# Patient Record
Sex: Male | Born: 1965 | Race: White | Hispanic: No | Marital: Married
Health system: Southern US, Community
[De-identification: ages and names within clinical notes are randomized; demographics above are authoritative.]

---

## 2007-12-10 ENCOUNTER — Ambulatory Visit: Payer: Self-pay | Admitting: Hematology and Oncology

## 2007-12-23 ENCOUNTER — Ambulatory Visit (HOSPITAL_COMMUNITY): Admission: RE | Admit: 2007-12-23 | Discharge: 2007-12-23 | Payer: Self-pay | Admitting: Hematology and Oncology

## 2007-12-23 LAB — CBC WITH DIFFERENTIAL/PLATELET
Basophils Absolute: 0.1 10*3/uL (ref 0.0–0.1)
Eosinophils Absolute: 0.2 10*3/uL (ref 0.0–0.5)
HGB: 17 g/dL (ref 13.0–17.1)
MCV: 92.5 fL (ref 81.6–98.0)
MONO%: 4.1 % (ref 0.0–13.0)
NEUT#: 9.6 10*3/uL — ABNORMAL HIGH (ref 1.5–6.5)
RDW: 11 % — ABNORMAL LOW (ref 11.2–14.6)
lymph#: 3.6 10*3/uL — ABNORMAL HIGH (ref 0.9–3.3)

## 2007-12-30 LAB — COMPREHENSIVE METABOLIC PANEL
Albumin: 4.8 g/dL (ref 3.5–5.2)
BUN: 11 mg/dL (ref 6–23)
Calcium: 9.5 mg/dL (ref 8.4–10.5)
Chloride: 103 mEq/L (ref 96–112)
Glucose, Bld: 89 mg/dL (ref 70–99)
Potassium: 4.6 mEq/L (ref 3.5–5.3)

## 2007-12-30 LAB — PROTEIN ELECTROPHORESIS, SERUM
Albumin ELP: 63.9 % (ref 55.8–66.1)
Alpha-2-Globulin: 11.3 % (ref 7.1–11.8)
Beta Globulin: 5.6 % (ref 4.7–7.2)
Gamma Globulin: 10.9 % — ABNORMAL LOW (ref 11.1–18.8)
Total Protein, Serum Electrophoresis: 7.1 g/dL (ref 6.0–8.3)

## 2007-12-30 LAB — LEUKOCYTE ALKALINE PHOS

## 2007-12-30 LAB — LACTATE DEHYDROGENASE: LDH: 151 U/L (ref 94–250)

## 2008-01-02 LAB — BCR/ABL

## 2008-07-05 ENCOUNTER — Inpatient Hospital Stay (HOSPITAL_COMMUNITY): Admission: EM | Admit: 2008-07-05 | Discharge: 2008-07-06 | Payer: Self-pay | Admitting: Emergency Medicine

## 2008-07-05 ENCOUNTER — Encounter (INDEPENDENT_AMBULATORY_CARE_PROVIDER_SITE_OTHER): Payer: Self-pay | Admitting: Surgery

## 2008-07-09 ENCOUNTER — Ambulatory Visit: Payer: Self-pay | Admitting: Hematology and Oncology

## 2008-07-13 LAB — CBC WITH DIFFERENTIAL/PLATELET
Basophils Absolute: 0 10*3/uL (ref 0.0–0.1)
Eosinophils Absolute: 0.2 10*3/uL (ref 0.0–0.5)
LYMPH%: 28.2 % (ref 14.0–48.0)
MCH: 35.3 pg — ABNORMAL HIGH (ref 28.0–33.4)
MCHC: 34.8 g/dL (ref 32.0–35.9)
MCV: 101.6 fL — ABNORMAL HIGH (ref 81.6–98.0)
MONO#: 0.5 10*3/uL (ref 0.1–0.9)
NEUT%: 65.8 % (ref 40.0–75.0)
RBC: 4.67 10*6/uL (ref 4.20–5.71)
RDW: 13.3 % (ref 11.2–14.6)
lymph#: 3.6 10*3/uL — ABNORMAL HIGH (ref 0.9–3.3)

## 2008-07-15 LAB — PROTEIN ELECTROPHORESIS, SERUM
Albumin ELP: 64.2 % (ref 55.8–66.1)
Alpha-1-Globulin: 4.6 % (ref 2.9–4.9)
Beta Globulin: 5.7 % (ref 4.7–7.2)
Total Protein, Serum Electrophoresis: 7 g/dL (ref 6.0–8.3)

## 2008-07-15 LAB — COMPREHENSIVE METABOLIC PANEL
BUN: 15 mg/dL (ref 6–23)
CO2: 26 mEq/L (ref 19–32)
Creatinine, Ser: 1.25 mg/dL (ref 0.40–1.50)
Glucose, Bld: 126 mg/dL — ABNORMAL HIGH (ref 70–99)
Sodium: 140 mEq/L (ref 135–145)
Total Bilirubin: 0.5 mg/dL (ref 0.3–1.2)
Total Protein: 7 g/dL (ref 6.0–8.3)

## 2009-01-11 ENCOUNTER — Ambulatory Visit: Payer: Self-pay | Admitting: Hematology and Oncology

## 2009-01-13 LAB — BASIC METABOLIC PANEL
BUN: 14 mg/dL (ref 6–23)
CO2: 26 mEq/L (ref 19–32)
Calcium: 9.6 mg/dL (ref 8.4–10.5)
Creatinine, Ser: 1.23 mg/dL (ref 0.40–1.50)
Glucose, Bld: 112 mg/dL — ABNORMAL HIGH (ref 70–99)

## 2009-01-13 LAB — CBC WITH DIFFERENTIAL/PLATELET
BASO%: 1 % (ref 0.0–2.0)
Basophils Absolute: 0.1 10*3/uL (ref 0.0–0.1)
HCT: 46.6 % (ref 38.7–49.9)
HGB: 16 g/dL (ref 13.0–17.1)
LYMPH%: 24 % (ref 14.0–48.0)
MCHC: 34.4 g/dL (ref 32.0–35.9)
MONO#: 0.6 10*3/uL (ref 0.1–0.9)
NEUT%: 69.5 % (ref 40.0–75.0)
Platelets: 230 10*3/uL (ref 145–400)
WBC: 14.9 10*3/uL — ABNORMAL HIGH (ref 4.0–10.0)

## 2009-02-05 IMAGING — CT CT PELVIS W/O CM
2 of 4 series · 17 of 46 positions shown, 19 images · non-contrast
Comparison: None available.

CTA ABDOMEN

CLINICAL DATA: Abdominal pain, right lower quadrant pain.
Elevated white blood cell count.

CT  ABDOMEN AND PELVIS
TECHNIQUE: Multidetector CT imaging of the abdomen and pelvis was
performed without administration of intravenous contrast.
Multiplanar reconstructed images obtained and reviewed to evaluate
the vascular anatomy.

[Series 2: 220 stone 5.0 b40f st · axial · 0.74mm/px · z∈[+911,+1271]mm · 14 of 78 slices shown, 16 images]
[im 3/78  soft-tissue]
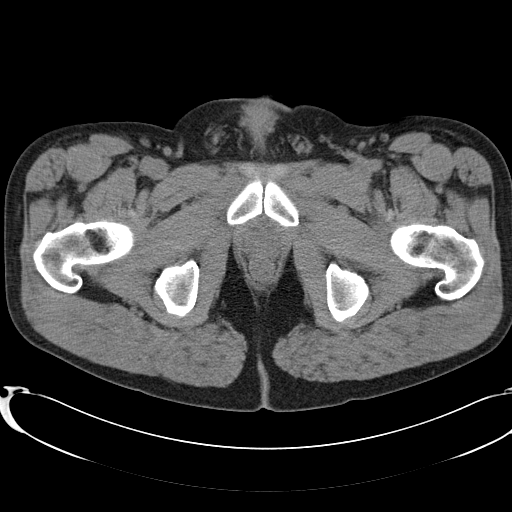
[im 3/78  bone]
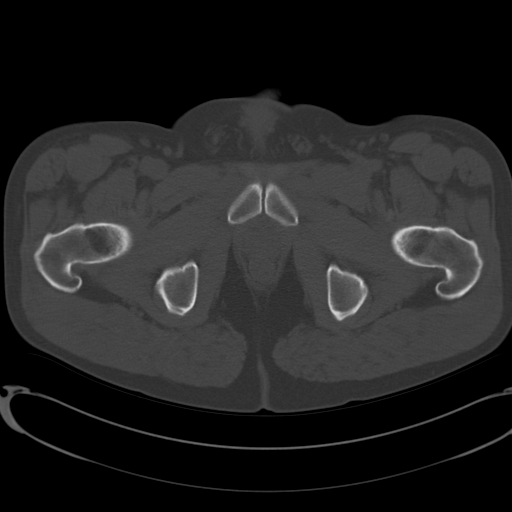
[im 9/78  soft-tissue]
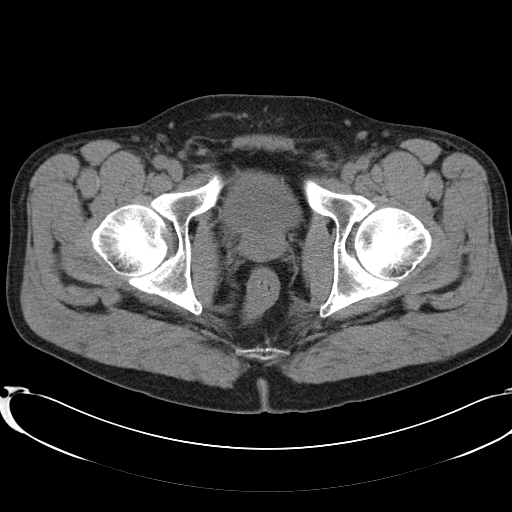
[im 15/78  soft-tissue]
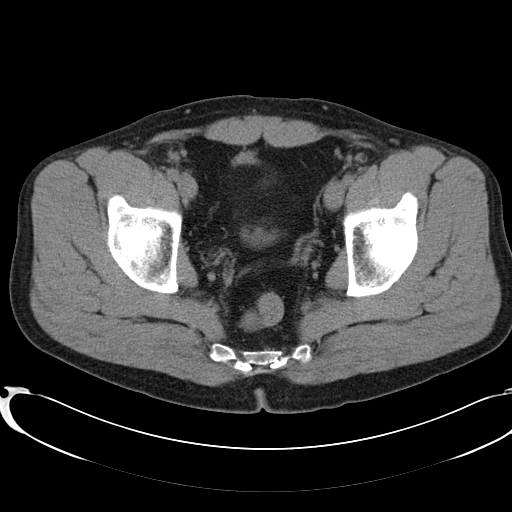
[im 20/78  soft-tissue]
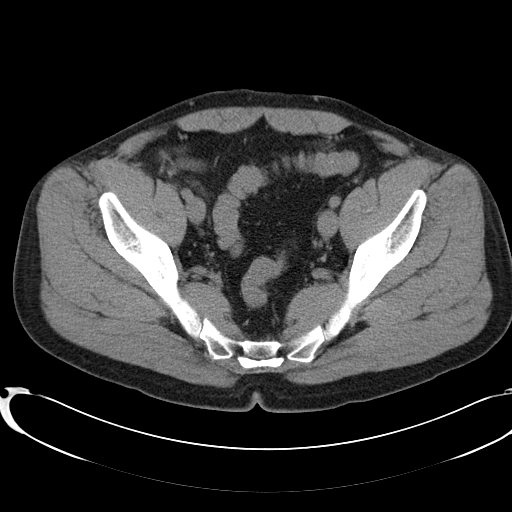
[im 26/78  soft-tissue]
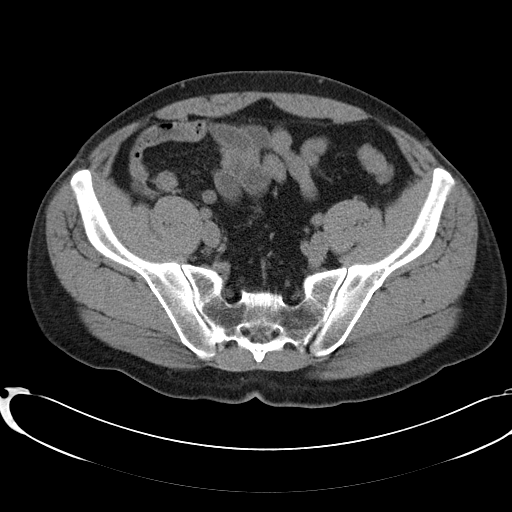
[im 32/78  soft-tissue]
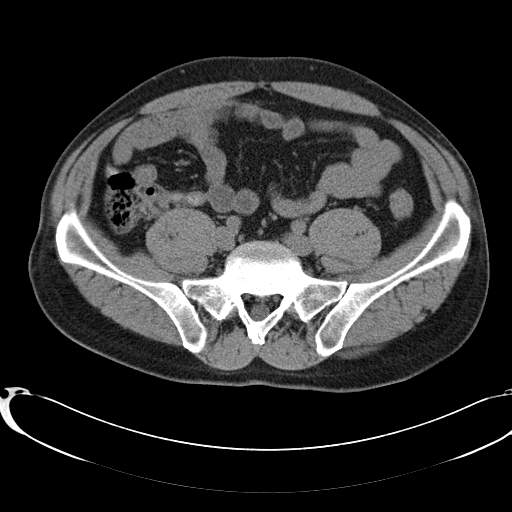
[im 38/78  soft-tissue]
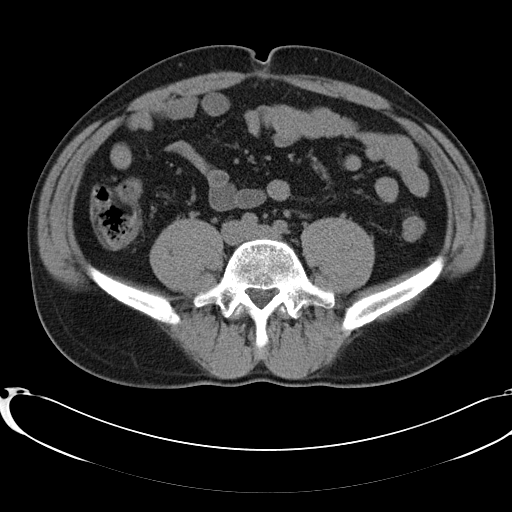
[im 40/78  soft-tissue]
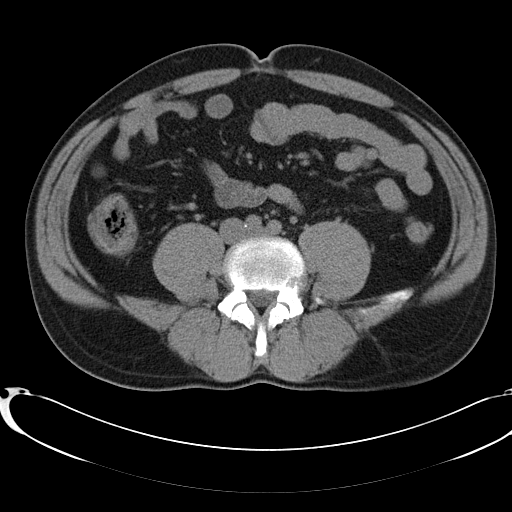
[im 46/78  soft-tissue]
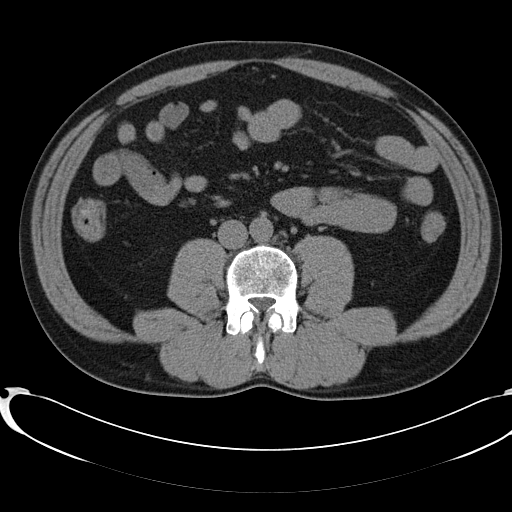
[im 46/78  bone]
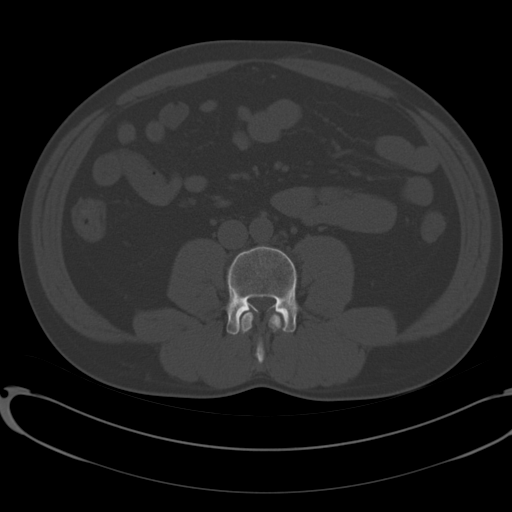
[im 52/78  soft-tissue]
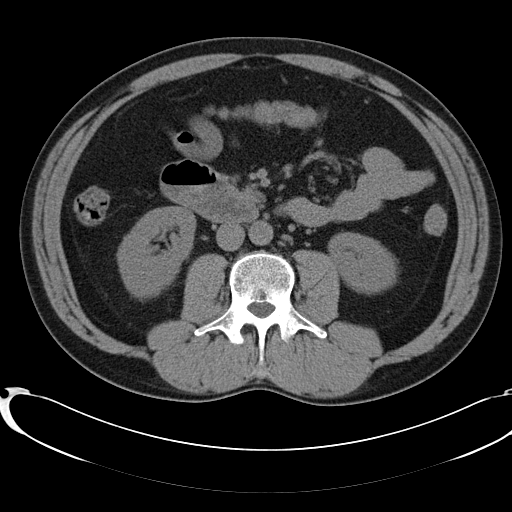
[im 58/78  soft-tissue]
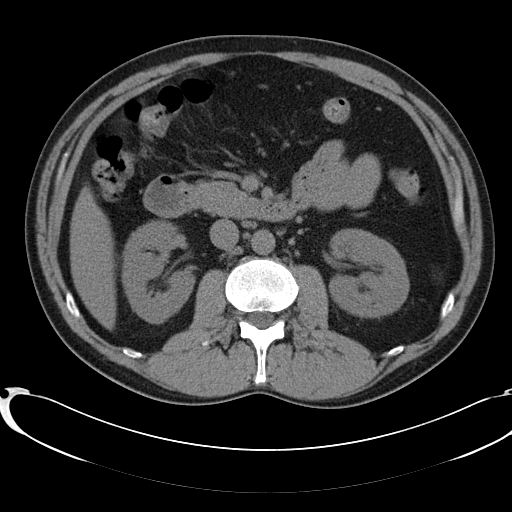
[im 63/78  soft-tissue]
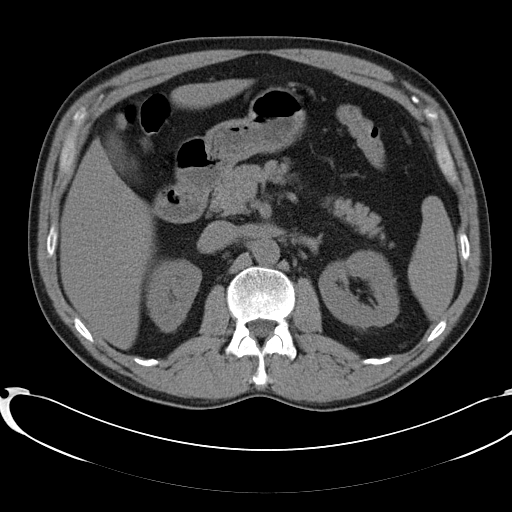
[im 69/78  soft-tissue]
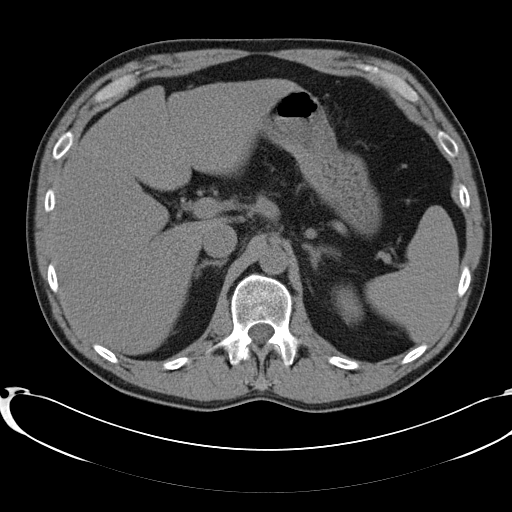
[im 75/78  soft-tissue]
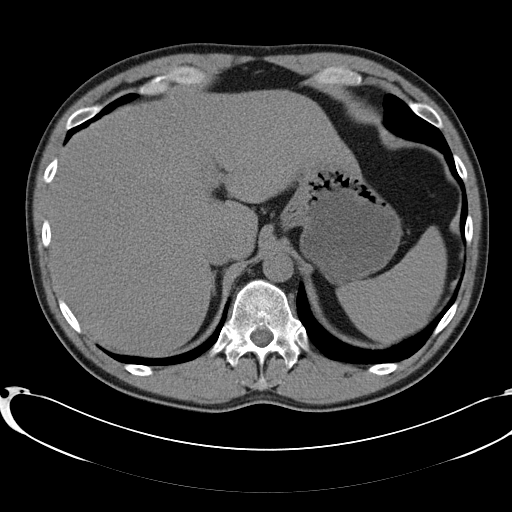

[Series 602: <mpr thick range> · coronal · 0.80mm/px · 3 of 76 slices shown]
[im 26/76  soft-tissue]
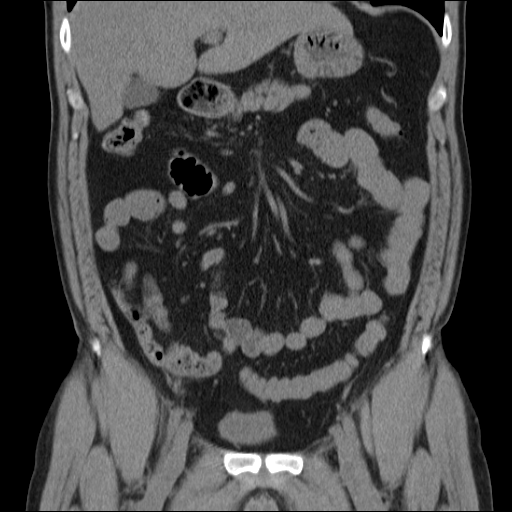
[im 34/76  soft-tissue]
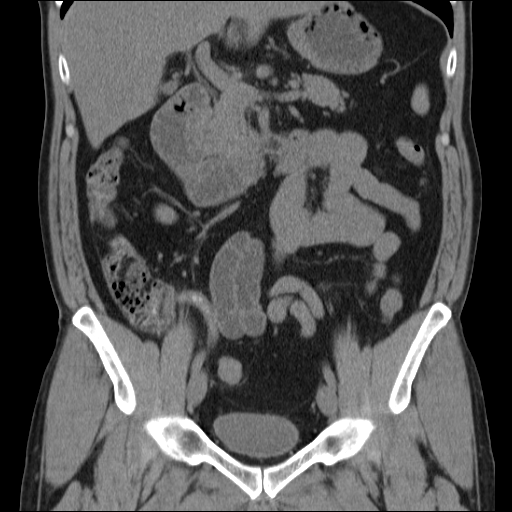
[im 42/76  soft-tissue]
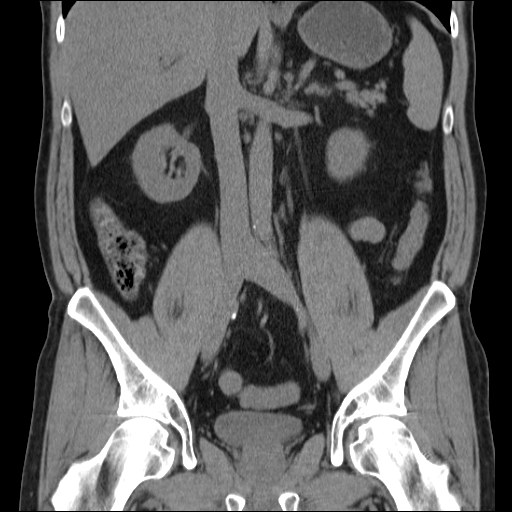

[17 of 46 positions shown; findings below may reference images not displayed]

FINDINGS: Minimal amount of lung parenchyma demonstrates some mild
atelectatic change is otherwise clear.  No pleural effusion is
identified.

The kidneys and ureters have a normal CT appearance bilaterally
without hydronephrosis or urinary tract stone.  The imaged portions
of the liver, gallbladder, biliary tree, adrenal glands, spleen and
pancreas also all appear normal.  Stomach and small bowel have a
normal uninfused appearance.  There is no abdominal lymphadenopathy
or fluid collection.  The no focal bony abnormality.
IMPRESSION: Negative abdomen CT scan.]]

CTA PELVIS
FINDINGS: The appendix is dilated with periappendiceal inflammatory
change.  High attenuation material is present within the appendix.
There is no perforation or abscess.  No pelvic fluid or
lymphadenopathy.  The colon appears normal.  No focal bony
abnormality.
IMPRESSION: Study is positive for appendicitis without abscess or perforation.

## 2009-04-12 ENCOUNTER — Encounter: Admission: RE | Admit: 2009-04-12 | Discharge: 2009-04-12 | Payer: Self-pay | Admitting: Family Medicine

## 2009-11-13 IMAGING — CR DG CHEST 2V
2 series · 2 of 2 positions shown · non-contrast
Comparison: 12/23/2007.

CLINICAL DATA: Cough

CHEST - 2 VIEW

[view not recorded (1 of 2)]
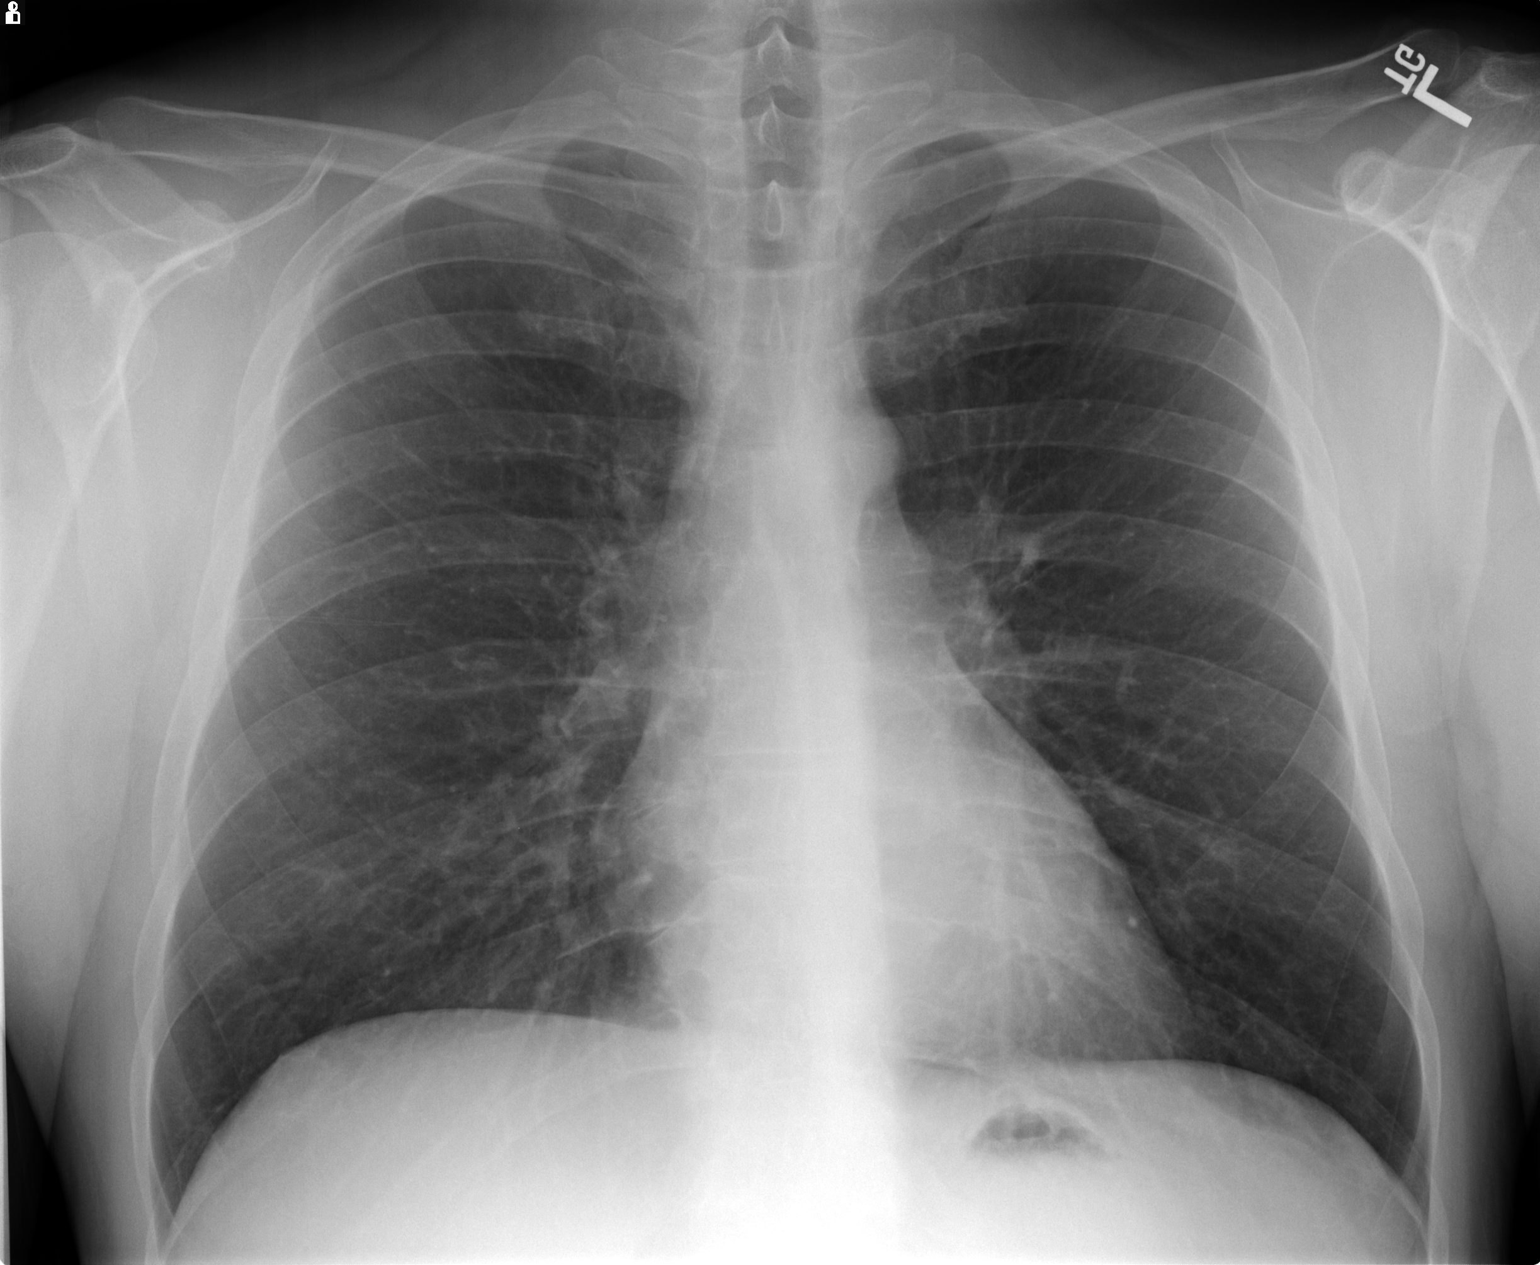

[view not recorded (2 of 2)]
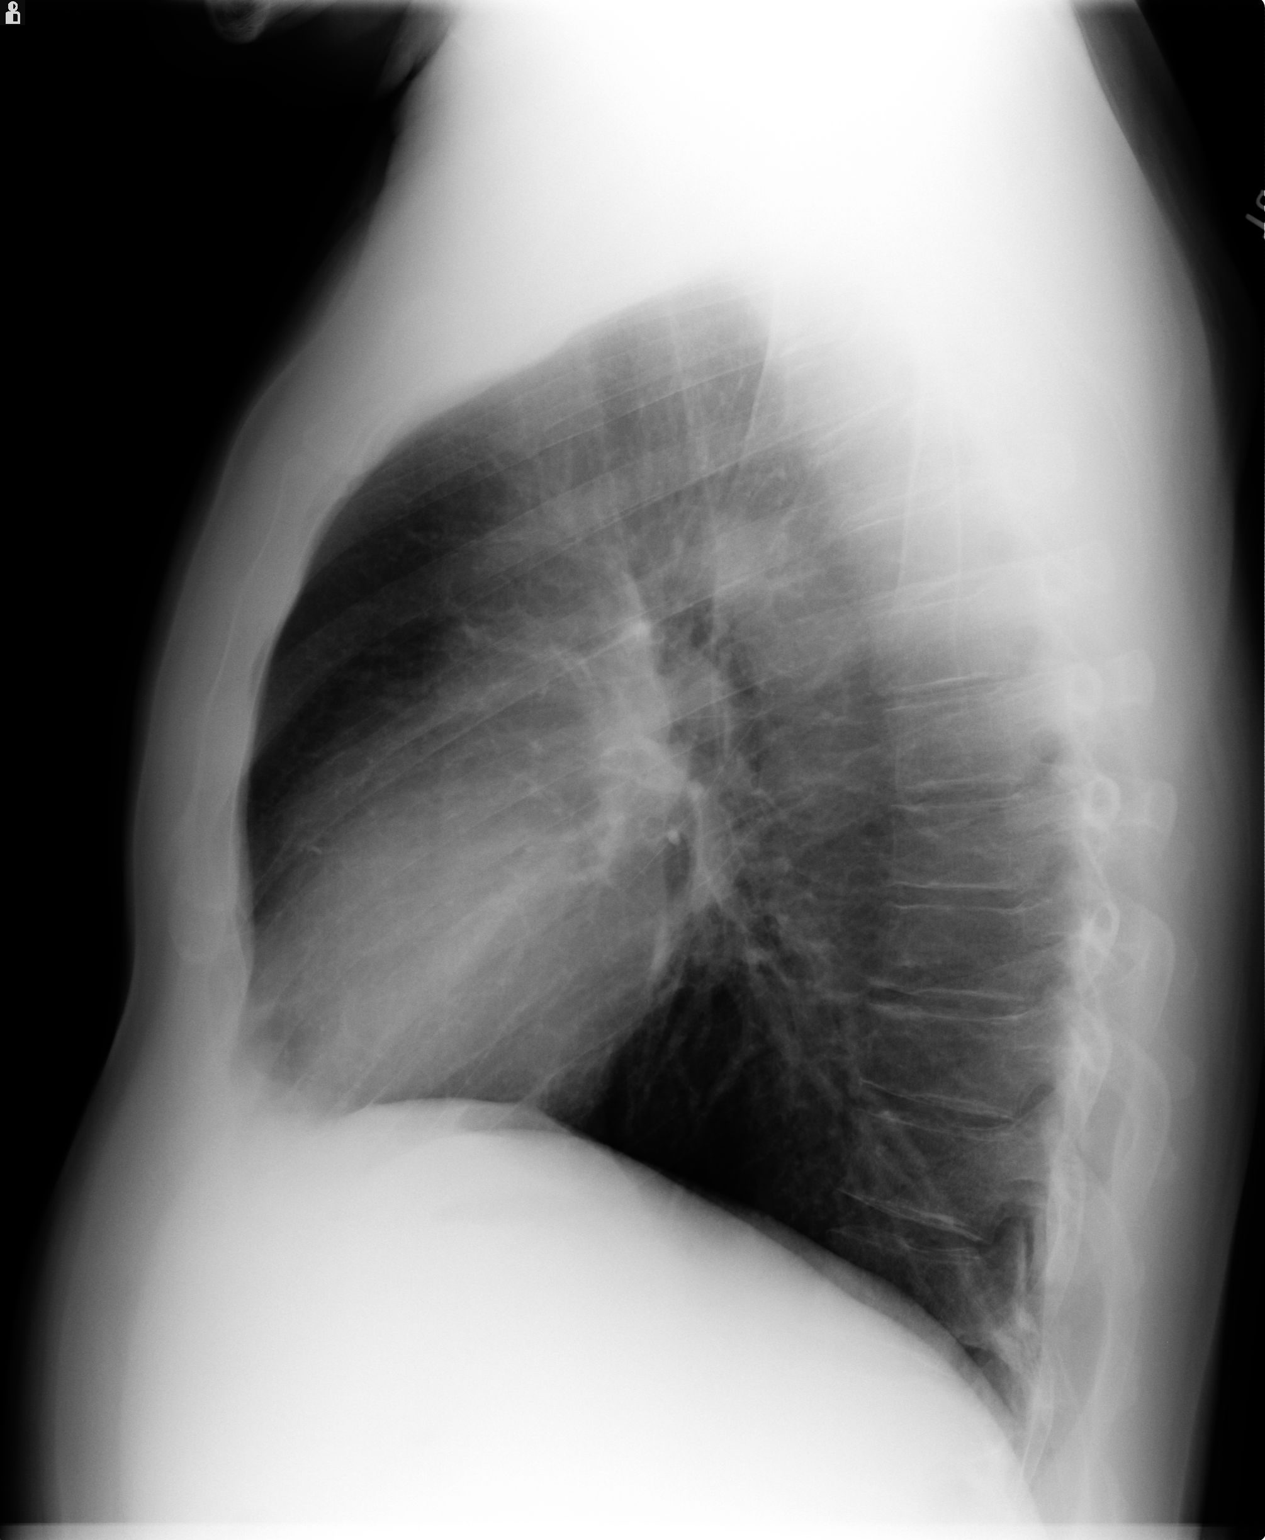

[2 of 2 positions shown; findings below may reference images not displayed]

FINDINGS: The lungs are clear.  The heart and mediastinal
structures are normal.  The osseous structures are unremarkable.
IMPRESSION: No evidence for active chest disease.

## 2011-04-10 NOTE — Op Note (Signed)
NAMEMercury, [name]                  ACCOUNT NO.:  [id number]   MEDICAL RECORD NO.:  [id number]          PATIENT TYPE:  EMS   LOCATION:  ED                           FACILITY:  [facility]   PHYSICIAN:  Sandria Bales. Ezzard Standing, M.D.  DATE OF BIRTH:  1966/06/29   DATE OF PROCEDURE:  07/05/2008  DATE OF DISCHARGE:                               OPERATIVE REPORT   Date of operation ??   PREOPERATIVE DIAGNOSIS:  Appendicitis.   POSTOPERATIVE DIAGNOSES:  1. Acute suppurative appendicitis.  2. Right inguinal hernia.   PROCEDURE:  Laparoscopic appendectomy.   SURGEON:  Sandria Bales. Ezzard Standing, M.D.  No first assistant.   ANESTHESIA:  General.   ESTIMATED BLOOD LOSS:  Minimal.   INDICATIONS FOR PROCEDURE:  [name] is a 45 year old white male who  has had about a 12-hour history of increasing abdominal pain localized  to the right lower quadrant.  CT scan is suggestive of appendicitis.   I discussed with the patient about proceeding with appendectomy.  I  discussed the indications and potential complications.  Potential  complication include but are not limited to bleeding, infection, which I  think he already has, bowel injury, and open surgery.   OPERATIVE NOTE:  The patient was placed in the supine position and given  a general endotracheal anesthetic.  He was given Invanz as an  antibiotic.  He45 had PAS stockings placed and a Foley catheter placed.  His abdomen was prepped with Betadine solution and sterilely draped, and  a time-out was held identifying the patient and the procedure.   I accessed the abdominal cavity through an infraumbilical incision, with  sharp dissection carried down to the peritoneal cavity.  The 0-degree 10  mm laparoscope was inserted through a 12 mm Hasson trocar.  The Hasson  trocar was secured with a 0 Vicryl suture.  Abdominal exploration was  carried out and revealed both lobes of the liver unremarkable and the  anterior wall of the stomach unremarkable.  In the right  lower quadrant,  the patient had a defect at the internal ring consistent with a probable  early hernia on the right side.  He actually had some adhesions in his  left lower quadrant consistent with possible prior diverticular disease.  I did not see a hernia on the left side. Two additional trocars were  placed, a 5 mm trocar in the right upper quadrant and a 10 mm trocar in  the left lower quadrant.  The appendix was stuck along the right pelvic  brim and elevated.  The mesentery of the appendix was taken down with  the harmonic scalpel.  I got down to the base of the appendix.  The  entire appendix was somewhat gangrenous looking, with some purulence on  the appendix.  I then used a vascular load of the staples, of the of 45  mm Ethicon Endo-GIA stapler across the base appendix and divided it  there.   I placed the appendix in an EndoCatch bag and delivered it through the  umbilicus and sent it to pathology.  I then reinserted  my trocars and  reinspected the staple line, which looked intact.  There was no  bleeding.  I irrigated the right colonic gutter and aspirated of the  purulent material out.  I irrigated about 300-400 mL.  I then removed  the trocars under direct visualization.  There was no bleeding at any  trocar site.  The skin at each site was closed with a 5-0 Monocryl  suture, painted with tincture of Benzoin, and Steri-Stripped.   The patient tolerated the procedure well and was transported to the  recovery room in good condition.  Sponge and needle counts were correct  at the end of the case.      Sandria Bales. Ezzard Standing, M.D.  Electronically Signed     DHN/MEDQ  D:  07/05/2008  T:  07/05/2008  Job:  [id number]   cc:   Stacie Acres. Cliffton Asters, M.D.  Fax: [phone]

## 2011-04-10 NOTE — H&P (Signed)
NAMEMako, [name] [place]                  ACCOUNT NO.:  [id number]   MEDICAL RECORD NO.:  [id number]          PATIENT TYPE:  EMS   LOCATION:  ED                           FACILITY:  [facility]   PHYSICIAN:  Sandria Bales. Ezzard Standing, M.D.  DATE OF BIRTH:  Jul 31, 1966   DATE OF ADMISSION:  07/05/2008  DATE OF DISCHARGE:                              HISTORY & PHYSICAL   Date of history and physical ??   CHIEF COMPLAINT:  Appendicitis.   HISTORY OF ILLNESS:  Mr. [name] is a 45 year old white male who actual  recently hails for [name], and is a patient of Dr. Laurann Montana, who  the last evening had some increasing abdominal pain and came to the  [facility] Emergency Room.  He has had no history of peptic ulcer  disease, liver disease, pancreatic disease, or colon disease.  In fact,  he just recently had a colonoscopy, though he cannot really remember the  name of the physicians who did the colonoscopy.  It sounds like he was  somebody at [facility].   Dr. Paula Libra, who was the ER physician, obtained a CT scan, which was  consistent with probable appendicitis.   PAST MEDICAL HISTORY:  He has no allergies.   CURRENT MEDICATIONS:  Include Wellbutrin, Xanax, and Levitra.   REVIEW OF SYSTEMS:  NEUROLOGIC:  He is seeing I think Dr. Orson Aloe, a  psychologist in town, for depression/anxiety, and this is why he is on  the Wellbutrin and Xanax.  PULMONARY:  Smokes 1 to 1-1/2 packs of cigarettes a day, knows this is  bad for his health.  CARDIAC:  He had no heart disease, chest pain, hypertension, or cardiac  catheterization.  GASTROINTESTINAL:  See history of present illness.  UROLOGIC:  No signs of kidney infections.   His wife is at home.  I talked to her on the phone.  Her phone numbers  to [phone].  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   PHYSICAL EXAMINATION:  VITAL SIGNS:  His temperature is 97.1, his blood  pressure is 113/70, his pulse is 81.  GENERAL:  He is a  well-nourished, somewhat tall white male, alert and  cooperative.  HEENT:  Unremarkable.  NECK:  Supple.  I found no masses or thyromegaly.  LUNGS:  Clear to auscultation, with symmetric breath sounds.  HEART:  Has a regular rate and rhythm.  I hear no murmur or rub.  ABDOMEN:  He has some right lower quadrant tenderness, maybe some  minimal guarding, but otherwise is unremarkable.  He has a few bowel  sounds.  EXTREMITIES:  Had good range of motion in all four extremities.  NEUROLOGIC:  Grossly intact to motor and sensory function.   His white blood count is 20,600, hemoglobin 15.8, hematocrit 45.9.  His  sodium is 139, potassium 4.1, chloride of 107, BUN of 15, creatinine of  1.2.  His lipase was 28.  I reviewed his CT scan, which does show a  somewhat distended appendix, with some periappendiceal inflammation  consistent with appendicitis.   IMPRESSION:  1. Appendicitis.  Discussed with the patient the indications and      potential complications of appendectomy.  Discussed potential      complications include but are not limited to bleeding, infection,      which I think he already has, the need for possible bowel      resection, and the possibility of open surgery.  I told him that      his hospitalization would depend on how bad his appendix is and how      the surgery goes.  2. He smokes cigarettes as knows it is bad for his health.  3. History of anxiety and depression.  4. History of recent colonoscopy, with polyps.      Sandria Bales. Ezzard Standing, M.D.  Electronically Signed     DHN/MEDQ  D:  07/05/2008  T:  07/05/2008  Job:  [id number]   cc:   Stacie Acres. Cliffton Asters, M.D.  Fax: [phone]

## 2011-08-24 LAB — URINALYSIS, ROUTINE W REFLEX MICROSCOPIC
Bilirubin Urine: NEGATIVE
Nitrite: NEGATIVE
Specific Gravity, Urine: 1.022
Urobilinogen, UA: 0.2
pH: 8

## 2011-08-24 LAB — CBC
MCV: 100.9 — ABNORMAL HIGH
Platelets: 244
WBC: 20.6 — ABNORMAL HIGH

## 2011-08-24 LAB — POCT I-STAT, CHEM 8
Chloride: 107
Glucose, Bld: 135 — ABNORMAL HIGH
HCT: 47
Potassium: 4.1
Sodium: 139

## 2011-08-24 LAB — DIFFERENTIAL
Basophils Absolute: 0.6 — ABNORMAL HIGH
Eosinophils Relative: 0
Monocytes Absolute: 0.6
Monocytes Relative: 3
Neutrophils Relative %: 85 — ABNORMAL HIGH

## 2011-08-24 LAB — HEPATIC FUNCTION PANEL
ALT: 30
Indirect Bilirubin: 0.9
Total Protein: 6.5

## 2017-12-10 DIAGNOSIS — J40 Bronchitis, not specified as acute or chronic: Secondary | ICD-10-CM | POA: Diagnosis not present

## 2018-04-19 DIAGNOSIS — J069 Acute upper respiratory infection, unspecified: Secondary | ICD-10-CM | POA: Diagnosis not present

## 2018-04-28 DIAGNOSIS — R6889 Other general symptoms and signs: Secondary | ICD-10-CM | POA: Diagnosis not present

## 2018-08-29 DIAGNOSIS — H1789 Other corneal scars and opacities: Secondary | ICD-10-CM | POA: Diagnosis not present

## 2018-08-29 DIAGNOSIS — H26492 Other secondary cataract, left eye: Secondary | ICD-10-CM | POA: Diagnosis not present

## 2018-09-16 DIAGNOSIS — S6991XA Unspecified injury of right wrist, hand and finger(s), initial encounter: Secondary | ICD-10-CM | POA: Diagnosis not present

## 2018-09-16 DIAGNOSIS — M129 Arthropathy, unspecified: Secondary | ICD-10-CM | POA: Diagnosis not present

## 2018-09-16 DIAGNOSIS — S62639A Displaced fracture of distal phalanx of unspecified finger, initial encounter for closed fracture: Secondary | ICD-10-CM | POA: Diagnosis not present

## 2018-09-16 DIAGNOSIS — S66911A Strain of unspecified muscle, fascia and tendon at wrist and hand level, right hand, initial encounter: Secondary | ICD-10-CM | POA: Diagnosis not present

## 2018-09-16 DIAGNOSIS — M79644 Pain in right finger(s): Secondary | ICD-10-CM | POA: Diagnosis not present

## 2018-09-17 DIAGNOSIS — M20011 Mallet finger of right finger(s): Secondary | ICD-10-CM | POA: Diagnosis not present

## 2018-09-23 DIAGNOSIS — S62632A Displaced fracture of distal phalanx of right middle finger, initial encounter for closed fracture: Secondary | ICD-10-CM | POA: Diagnosis not present

## 2018-10-07 DIAGNOSIS — S62632A Displaced fracture of distal phalanx of right middle finger, initial encounter for closed fracture: Secondary | ICD-10-CM | POA: Diagnosis not present

## 2018-11-06 DIAGNOSIS — S62632A Displaced fracture of distal phalanx of right middle finger, initial encounter for closed fracture: Secondary | ICD-10-CM | POA: Diagnosis not present

## 2018-11-28 DIAGNOSIS — E785 Hyperlipidemia, unspecified: Secondary | ICD-10-CM | POA: Diagnosis not present

## 2018-11-28 DIAGNOSIS — Z0000 Encounter for general adult medical examination without abnormal findings: Secondary | ICD-10-CM | POA: Diagnosis not present

## 2018-11-28 DIAGNOSIS — Z125 Encounter for screening for malignant neoplasm of prostate: Secondary | ICD-10-CM | POA: Diagnosis not present

## 2018-11-28 DIAGNOSIS — R7303 Prediabetes: Secondary | ICD-10-CM | POA: Diagnosis not present

## 2018-12-08 DIAGNOSIS — K648 Other hemorrhoids: Secondary | ICD-10-CM | POA: Diagnosis not present

## 2019-01-26 DIAGNOSIS — Z1211 Encounter for screening for malignant neoplasm of colon: Secondary | ICD-10-CM | POA: Diagnosis not present

## 2019-01-26 DIAGNOSIS — D125 Benign neoplasm of sigmoid colon: Secondary | ICD-10-CM | POA: Diagnosis not present

## 2019-01-26 DIAGNOSIS — K5520 Angiodysplasia of colon without hemorrhage: Secondary | ICD-10-CM | POA: Diagnosis not present

## 2019-01-26 DIAGNOSIS — D120 Benign neoplasm of cecum: Secondary | ICD-10-CM | POA: Diagnosis not present

## 2019-01-26 DIAGNOSIS — D123 Benign neoplasm of transverse colon: Secondary | ICD-10-CM | POA: Diagnosis not present

## 2020-02-18 ENCOUNTER — Ambulatory Visit: Payer: Self-pay | Attending: Internal Medicine

## 2020-02-18 DIAGNOSIS — Z23 Encounter for immunization: Secondary | ICD-10-CM

## 2020-02-18 NOTE — Progress Notes (Signed)
   Covid-19 Vaccination Clinic  Name:  ATOM HERRERA    MRN: [id number] DOB: 1966-05-18  02/18/2020  Mr. Bilodeau was observed post Covid-19 immunization for 15 minutes without incident. He was provided with Vaccine Information Sheet and instruction to access the V-Safe system.   Mr. Canuto was instructed to call 911 with any severe reactions post vaccine: Marland Kitchen Difficulty breathing  . Swelling of face and throat  . A fast heartbeat  . A bad rash all over body  . Dizziness and weakness   Immunizations Administered    Name Date Dose VIS Date Route   Pfizer COVID-19 Vaccine 02/18/2020  8:39 AM 0.3 mL 11/06/2019 Intramuscular   Manufacturer: [place]   Lot: [id number]   [place]: [id number]

## 2020-03-14 ENCOUNTER — Ambulatory Visit: Payer: Self-pay | Attending: Internal Medicine

## 2020-03-14 DIAGNOSIS — Z23 Encounter for immunization: Secondary | ICD-10-CM

## 2020-03-14 NOTE — Progress Notes (Signed)
   Covid-19 Vaccination Clinic  Name:  [name]    MRN: [id number] DOB: 30-Oct-1966  03/14/2020  [name] was observed post Covid-19 immunization for 15 minutes without incident. He was provided with Vaccine Information Sheet and instruction to access the V-Safe system.   [name] was instructed to call 911 with any severe reactions post vaccine: Marland Kitchen Difficulty breathing  . Swelling of face and throat  . A fast heartbeat  . A bad rash all over body  . Dizziness and weakness   Immunizations Administered    Name Date Dose VIS Date Route   Pfizer COVID-19 Vaccine 03/14/2020  8:26 AM 0.3 mL 01/20/2019 Intramuscular   Manufacturer: [place]   Lot: [id number]   [place]: [id number]

## 2021-11-08 DIAGNOSIS — B349 Viral infection, unspecified: Secondary | ICD-10-CM | POA: Diagnosis not present

## 2021-11-08 DIAGNOSIS — R051 Acute cough: Secondary | ICD-10-CM | POA: Diagnosis not present

## 2021-11-08 DIAGNOSIS — R0989 Other specified symptoms and signs involving the circulatory and respiratory systems: Secondary | ICD-10-CM | POA: Diagnosis not present

## 2021-11-08 DIAGNOSIS — Z03818 Encounter for observation for suspected exposure to other biological agents ruled out: Secondary | ICD-10-CM | POA: Diagnosis not present

## 2021-11-08 DIAGNOSIS — R509 Fever, unspecified: Secondary | ICD-10-CM | POA: Diagnosis not present

## 2022-09-24 DIAGNOSIS — B349 Viral infection, unspecified: Secondary | ICD-10-CM | POA: Diagnosis not present

## 2022-09-24 DIAGNOSIS — Z03818 Encounter for observation for suspected exposure to other biological agents ruled out: Secondary | ICD-10-CM | POA: Diagnosis not present

## 2022-09-24 DIAGNOSIS — R059 Cough, unspecified: Secondary | ICD-10-CM | POA: Diagnosis not present

## 2022-10-02 ENCOUNTER — Ambulatory Visit: Payer: BC Managed Care – PPO | Admitting: Podiatry

## 2022-10-02 ENCOUNTER — Encounter: Payer: Self-pay | Admitting: Podiatry

## 2022-10-02 DIAGNOSIS — M79671 Pain in right foot: Secondary | ICD-10-CM

## 2022-10-02 DIAGNOSIS — M10471 Other secondary gout, right ankle and foot: Secondary | ICD-10-CM

## 2022-10-02 MED ORDER — COLCHICINE 0.6 MG PO TABS: ORAL_TABLET | ORAL | 2 refills | Status: AC

## 2022-10-03 LAB — CBC WITH DIFFERENTIAL/PLATELET
Basophils Absolute: 0.1 10*3/uL (ref 0.0–0.2)
Basos: 1 %
EOS (ABSOLUTE): 0.1 10*3/uL (ref 0.0–0.4)
Eos: 1 %
Hematocrit: 43.6 % (ref 37.5–51.0)
Hemoglobin: 14.4 g/dL (ref 13.0–17.7)
Immature Grans (Abs): 0.2 10*3/uL — ABNORMAL HIGH (ref 0.0–0.1)
Immature Granulocytes: 1 %
Lymphocytes Absolute: 2.8 10*3/uL (ref 0.7–3.1)
Lymphs: 24 %
MCH: 30.7 pg (ref 26.6–33.0)
MCHC: 33 g/dL (ref 31.5–35.7)
MCV: 93 fL (ref 79–97)
Monocytes Absolute: 0.7 10*3/uL (ref 0.1–0.9)
Monocytes: 6 %
Neutrophils Absolute: 7.6 10*3/uL — ABNORMAL HIGH (ref 1.4–7.0)
Neutrophils: 67 %
Platelets: 323 10*3/uL (ref 150–450)
RBC: 4.69 x10E6/uL (ref 4.14–5.80)
RDW: 12 % (ref 11.6–15.4)
WBC: 11.4 10*3/uL — ABNORMAL HIGH (ref 3.4–10.8)

## 2022-10-03 LAB — ARTHRITIS PANEL
Anti Nuclear Antibody (ANA): NEGATIVE
Rhuematoid fact SerPl-aCnc: 11.1 IU/mL (ref <14.0)
Sed Rate: 6 mm/hr (ref 0–30)
Uric Acid: 7.2 mg/dL (ref 3.8–8.4)

## 2022-10-03 LAB — BASIC METABOLIC PANEL
BUN/Creatinine Ratio: 11 (ref 9–20)
BUN: 11 mg/dL (ref 6–24)
CO2: 25 mmol/L (ref 20–29)
Calcium: 9.1 mg/dL (ref 8.7–10.2)
Chloride: 101 mmol/L (ref 96–106)
Creatinine, Ser: 1.04 mg/dL (ref 0.76–1.27)
Glucose: 87 mg/dL (ref 70–99)
Potassium: 4.8 mmol/L (ref 3.5–5.2)
Sodium: 139 mmol/L (ref 134–144)
eGFR: 84 mL/min/{1.73_m2} (ref >59)

## 2022-10-05 NOTE — Progress Notes (Signed)
  Subjective:  Patient ID: [name], male    DOB: September 06, 1966,  MRN: [id number]  Chief Complaint  Patient presents with   Foot Pain    NP - R GREAT TOE - UNDER TOE BALL OF FOOT PAINFUL / HARD TO WALK ON    56 y.o. male presents with the above complaint. History confirmed with patient.   Objective:  Physical Exam: warm, good capillary refill, no trophic changes or ulcerative lesions, normal DP and PT pulses, normal sensory exam, and erythematous edematous painful right first MTPJ.  Assessment:   1. 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   We discussed the etiology and treatment options of acute gout.  I recommended evaluation with lab work and arthritis panel, CBC and basic metabolic panel ordered.  Rx for colchicine sent to pharmacy.  We discussed lifestyle modifications that can help with this.  He will return to see me as needed if it does not improve or worsens.  Return if symptoms worsen or fail to improve.

## 2022-11-21 DIAGNOSIS — L720 Epidermal cyst: Secondary | ICD-10-CM | POA: Diagnosis not present

## 2023-12-23 DIAGNOSIS — J101 Influenza due to other identified influenza virus with other respiratory manifestations: Secondary | ICD-10-CM | POA: Diagnosis not present

## 2023-12-23 DIAGNOSIS — R52 Pain, unspecified: Secondary | ICD-10-CM | POA: Diagnosis not present

## 2023-12-23 DIAGNOSIS — R5383 Other fatigue: Secondary | ICD-10-CM | POA: Diagnosis not present

## 2023-12-23 DIAGNOSIS — R509 Fever, unspecified: Secondary | ICD-10-CM | POA: Diagnosis not present

## 2024-05-15 DIAGNOSIS — Z207 Contact with and (suspected) exposure to pediculosis, acariasis and other infestations: Secondary | ICD-10-CM | POA: Diagnosis not present
